# Patient Record
Sex: Female | Born: 1975 | Race: White | Hispanic: No | Marital: Married | Smoking: Never smoker
Health system: Southern US, Community
[De-identification: ages and names within clinical notes are randomized; demographics above are authoritative.]

## PROBLEM LIST (undated history)

## (undated) DIAGNOSIS — Z789 Other specified health status: Secondary | ICD-10-CM

## (undated) DIAGNOSIS — R87629 Unspecified abnormal cytological findings in specimens from vagina: Secondary | ICD-10-CM

## (undated) DIAGNOSIS — M419 Scoliosis, unspecified: Secondary | ICD-10-CM

## (undated) HISTORY — PX: WISDOM TOOTH EXTRACTION: SHX21

## (undated) HISTORY — DX: Scoliosis, unspecified: M41.9

---

## 1993-06-22 HISTORY — PX: HAND TENDON SURGERY: SHX663

## 1998-06-22 HISTORY — PX: SHOULDER ARTHROSCOPY: SHX128

## 2014-09-05 ENCOUNTER — Ambulatory Visit (HOSPITAL_COMMUNITY)
Admission: RE | Admit: 2014-09-05 | Discharge: 2014-09-05 | Disposition: A | Discharge status: Home / Self Care | Payer: BLUE CROSS/BLUE SHIELD | Source: Ambulatory Visit | Attending: Obstetrics and Gynecology | Admitting: Obstetrics and Gynecology

## 2014-09-05 ENCOUNTER — Encounter (HOSPITAL_COMMUNITY): Payer: Self-pay | Admitting: *Deleted

## 2014-09-05 ENCOUNTER — Ambulatory Visit (HOSPITAL_COMMUNITY): Payer: BLUE CROSS/BLUE SHIELD | Admitting: Certified Registered Nurse Anesthetist

## 2014-09-05 ENCOUNTER — Encounter (HOSPITAL_COMMUNITY)
Admission: RE | Disposition: A | Discharge status: Home / Self Care | Payer: Self-pay | Source: Ambulatory Visit | Attending: Obstetrics and Gynecology

## 2014-09-05 DIAGNOSIS — Q5211 Transverse vaginal septum: Secondary | ICD-10-CM | POA: Diagnosis not present

## 2014-09-05 DIAGNOSIS — O021 Missed abortion: Secondary | ICD-10-CM | POA: Diagnosis not present

## 2014-09-05 DIAGNOSIS — Z3A01 Less than 8 weeks gestation of pregnancy: Secondary | ICD-10-CM | POA: Diagnosis not present

## 2014-09-05 HISTORY — DX: Other specified health status: Z78.9

## 2014-09-05 HISTORY — PX: DILATION AND EVACUATION: SHX1459

## 2014-09-05 LAB — ABO/RH: ABO/RH(D): B POS

## 2014-09-05 LAB — CBC
HCT: 38.8 % (ref 36.0–46.0)
HEMOGLOBIN: 13.2 g/dL (ref 12.0–15.0)
MCH: 31.1 pg (ref 26.0–34.0)
MCHC: 34 g/dL (ref 30.0–36.0)
MCV: 91.5 fL (ref 78.0–100.0)
Platelets: 247 10*3/uL (ref 150–400)
RBC: 4.24 MIL/uL (ref 3.87–5.11)
RDW: 12.4 % (ref 11.5–15.5)
WBC: 7 10*3/uL (ref 4.0–10.5)

## 2014-09-05 SURGERY — DILATION AND EVACUATION, UTERUS
Anesthesia: Monitor Anesthesia Care | Site: Vagina

## 2014-09-05 MED ORDER — SCOPOLAMINE 1 MG/3DAYS TD PT72
MEDICATED_PATCH | TRANSDERMAL | Status: AC
  Administered 2014-09-05: 1.5 mg via TRANSDERMAL
  Filled 2014-09-05: qty 1

## 2014-09-05 MED ORDER — FENTANYL CITRATE 0.05 MG/ML IJ SOLN
INTRAMUSCULAR | Status: AC
  Filled 2014-09-05: qty 2

## 2014-09-05 MED ORDER — MEPERIDINE HCL 25 MG/ML IJ SOLN: 6.2500 mg | INTRAMUSCULAR | Status: DC | PRN

## 2014-09-05 MED ORDER — ACETAMINOPHEN 325 MG PO TABS: 325.0000 mg | ORAL_TABLET | ORAL | Status: DC | PRN

## 2014-09-05 MED ORDER — MIDAZOLAM HCL 2 MG/2ML IJ SOLN
INTRAMUSCULAR | Status: AC
  Filled 2014-09-05: qty 2

## 2014-09-05 MED ORDER — SCOPOLAMINE 1 MG/3DAYS TD PT72
1.0000 | MEDICATED_PATCH | Freq: Once | TRANSDERMAL | Status: DC
  Administered 2014-09-05: 1.5 mg via TRANSDERMAL

## 2014-09-05 MED ORDER — MIDAZOLAM HCL 2 MG/2ML IJ SOLN
INTRAMUSCULAR | Status: DC | PRN
  Administered 2014-09-05: 2 mg via INTRAVENOUS

## 2014-09-05 MED ORDER — ACETAMINOPHEN 160 MG/5ML PO SOLN: 325.0000 mg | ORAL | Status: DC | PRN

## 2014-09-05 MED ORDER — LIDOCAINE HCL (CARDIAC) 20 MG/ML IV SOLN
INTRAVENOUS | Status: DC | PRN
  Administered 2014-09-05: 40 mg via INTRAVENOUS

## 2014-09-05 MED ORDER — KETOROLAC TROMETHAMINE 30 MG/ML IJ SOLN
INTRAMUSCULAR | Status: DC | PRN
  Administered 2014-09-05: 30 mg via INTRAVENOUS

## 2014-09-05 MED ORDER — MIDAZOLAM HCL 2 MG/2ML IJ SOLN: 0.5000 mg | Freq: Once | INTRAMUSCULAR | Status: DC | PRN

## 2014-09-05 MED ORDER — LACTATED RINGERS IV SOLN
INTRAVENOUS | Status: DC
  Administered 2014-09-05 (×2): via INTRAVENOUS

## 2014-09-05 MED ORDER — ONDANSETRON HCL 4 MG/2ML IJ SOLN
INTRAMUSCULAR | Status: AC
  Filled 2014-09-05: qty 2

## 2014-09-05 MED ORDER — KETOROLAC TROMETHAMINE 30 MG/ML IJ SOLN: 30.0000 mg | Freq: Once | INTRAMUSCULAR | Status: DC | PRN

## 2014-09-05 MED ORDER — DEXAMETHASONE SODIUM PHOSPHATE 10 MG/ML IJ SOLN
INTRAMUSCULAR | Status: DC | PRN
  Administered 2014-09-05: 4 mg via INTRAVENOUS

## 2014-09-05 MED ORDER — ONDANSETRON HCL 4 MG/2ML IJ SOLN
INTRAMUSCULAR | Status: DC | PRN
  Administered 2014-09-05: 4 mg via INTRAVENOUS

## 2014-09-05 MED ORDER — PROMETHAZINE HCL 25 MG/ML IJ SOLN: 6.2500 mg | INTRAMUSCULAR | Status: DC | PRN

## 2014-09-05 MED ORDER — FENTANYL CITRATE 0.05 MG/ML IJ SOLN
25.0000 ug | INTRAMUSCULAR | Status: DC | PRN
  Administered 2014-09-05: 25 ug via INTRAVENOUS

## 2014-09-05 MED ORDER — IBUPROFEN 800 MG PO TABS: 800.0000 mg | ORAL_TABLET | Freq: Three times a day (TID) | ORAL | Status: DC | PRN

## 2014-09-05 MED ORDER — LIDOCAINE HCL 1 % IJ SOLN
INTRAMUSCULAR | Status: AC
  Filled 2014-09-05: qty 20

## 2014-09-05 MED ORDER — PROPOFOL 10 MG/ML IV EMUL
INTRAVENOUS | Status: DC | PRN
  Administered 2014-09-05 (×2): 40 mg via INTRAVENOUS
  Administered 2014-09-05: 20 mg via INTRAVENOUS
  Administered 2014-09-05: 40 mg via INTRAVENOUS
  Administered 2014-09-05: 20 mg via INTRAVENOUS

## 2014-09-05 MED ORDER — FENTANYL CITRATE 0.05 MG/ML IJ SOLN
INTRAMUSCULAR | Status: DC | PRN
  Administered 2014-09-05: 100 ug via INTRAVENOUS

## 2014-09-05 MED ORDER — LIDOCAINE HCL 1 % IJ SOLN
INTRAMUSCULAR | Status: DC | PRN
  Administered 2014-09-05: 15 mL

## 2014-09-05 SURGICAL SUPPLY — 17 items
CATH ROBINSON RED A/P 16FR (CATHETERS) ×3 IMPLANT
CLOTH BEACON ORANGE TIMEOUT ST (SAFETY) ×3 IMPLANT
DECANTER SPIKE VIAL GLASS SM (MISCELLANEOUS) ×3 IMPLANT
GLOVE BIO SURGEON STRL SZ7 (GLOVE) ×6 IMPLANT
GOWN STRL REUS W/TWL LRG LVL3 (GOWN DISPOSABLE) ×6 IMPLANT
KIT BERKELEY 1ST TRIMESTER 3/8 (MISCELLANEOUS) ×3 IMPLANT
NS IRRIG 1000ML POUR BTL (IV SOLUTION) ×3 IMPLANT
PACK VAGINAL MINOR WOMEN LF (CUSTOM PROCEDURE TRAY) ×3 IMPLANT
PAD OB MATERNITY 4.3X12.25 (PERSONAL CARE ITEMS) ×3 IMPLANT
PAD PREP 24X48 CUFFED NSTRL (MISCELLANEOUS) ×3 IMPLANT
SET BERKELEY SUCTION TUBING (SUCTIONS) ×3 IMPLANT
SUT CHROMIC 2 0 SH (SUTURE) ×3 IMPLANT
TOWEL OR 17X24 6PK STRL BLUE (TOWEL DISPOSABLE) ×6 IMPLANT
VACURETTE 10 RIGID CVD (CANNULA) IMPLANT
VACURETTE 7MM CVD STRL WRAP (CANNULA) ×3 IMPLANT
VACURETTE 8 RIGID CVD (CANNULA) IMPLANT
VACURETTE 9 RIGID CVD (CANNULA) IMPLANT

## 2014-09-05 NOTE — H&P (Signed)
NAME:  Sylvia Holmes, Sylvia Holmes                  ACCOUNT NO.:  MEDICAL RECORD NO.:  [id number]  LOCATION:                                 FACILITY:  PHYSICIAN:  Duke Salviaichard M. Marcelle OverlieHolland, M.D.DATE OF BIRTH:  21-Sep-1975  DATE OF ADMISSION: DATE OF DISCHARGE:                             HISTORY & PHYSICAL   CHIEF COMPLAINT:  Missed AB.  HPI:  A 39 year old, G1, P0, this patient was noted recently to have a missed AB by ultrasound criteria, initially was offered Cytotec for medical expulsion.  She did have some cramping, but failed to pass tissue, was followed up the day prior to surgery with followup ultrasound that showed a large gestational sac.  No fetal pole.  No yolk sac.  She is opted to proceed with scheduled D and E.  This procedure including specific risks related to bleeding, infection, other complications may require additional surgery reviewed with her.  PAST MEDICAL HISTORY:  Allergies:  Penicillin.  CURRENT MEDICATIONS:  Prenatal vitamins.  No other prescription medicines.  REVIEW OF SYSTEMS:  Unremarkable.  FAMILY HISTORY:  Mother with a history of thyroid disease.  Father with arthritis and her father has had an unspecified type of cancer.  SOCIAL HISTORY:  Denies drug or tobacco use, social alcohol use.  She is married.  PHYSICAL EXAMINATION:  VITAL SIGNS:  Temp 98.2, blood pressure 120/78. HEENT:  Unremarkable. NECK:  Supple without masses. LUNGS:  Clear. CARDIOVASCULAR:  Regular rate and rhythm without murmurs, rubs, or gallops.  BREASTS:  Not examined. ABDOMEN:  Soft, flat, nontender.  Vulva, vagina, cervix normal.  Uterus was 6 week size mid position.  Adnexa negative.  Her blood type is B positive.  IMPRESSION:  Missed abortion.  PLAN:  D and E procedure and risks reviewed as above.     Sylvia Holmes M. Marcelle OverlieHolland, M.D.     RMH/MEDQ  D:  09/05/2014  T:  09/05/2014  Job:  [id number]

## 2014-09-05 NOTE — Op Note (Signed)
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 The bladder was drained. Appropriate timeouts taken at that point. EUA revealed the uterus was 7 week size mid position, but on palpation felt that there may been a circumferential tight band in the upper vagina indeed upon placing the speculum revealed her to be remnants of 5 upper vaginal transverse septum. Speculum was positioned cervix grasped with tenaculum the cervical os are paracervical block was anchored infant will created by infiltrating at 3 and 9:00 submucosally, 5-7 cc 1% Xylocaine after negative aspiration at each site. The uterus is then easily sounded to 8 cm progressively dilated to a 23-24 Pratt dilator. A #7 curved suction curet was then used to curette a small amount of tissue. When no further tissue could be removed, a medium blunt curette was used to explore the cavity revealing it to be completely clear. On further inspection there was a superficial left vaginal laceration I think that was a result of stretching the upper vaginal band. This was reapproximated with 2-0 chromic interrupted sutures and was hemostatic. She tolerated this well went to recovery room in good condition.  Dictated with [facility]  Yaniyah Koors Milana ObeyM Biannca Scantlin M.D.

## 2014-09-05 NOTE — Anesthesia Postprocedure Evaluation (Signed)
  Anesthesia Post Note  Patient: Sylvia Holmes  Procedure(s) Performed: Procedure(s) (LRB): DILATATION AND EVACUATION (N/A)  Anesthesia type: MAC  Patient location: PACU  Post pain: Pain level controlled  Post assessment: Post-op Vital signs reviewed  Last Vitals:  Filed Vitals:   09/05/14 1650  BP: 97/56  Pulse: 50  Temp: 36.7 C  Resp: 20    Post vital signs: Reviewed  Level of consciousness: sedated  Complications: No apparent anesthesia complications

## 2014-09-05 NOTE — Progress Notes (Signed)
The patient was re-examined with no change in status 

## 2014-09-05 NOTE — Anesthesia Preprocedure Evaluation (Signed)
Anesthesia Evaluation  Patient identified by MRN, date of birth, ID band Patient awake    Reviewed: Allergy & Precautions, H&P , Patient's Chart, lab work & pertinent test results, reviewed documented beta blocker date and time   History of Anesthesia Complications Negative for: history of anesthetic complications  Airway Mallampati: II TM Distance: >3 FB Neck ROM: full    Dental   Pulmonary  breath sounds clear to auscultation        Cardiovascular Exercise Tolerance: Good Rhythm:regular Rate:Normal     Neuro/Psych negative psych ROS   GI/Hepatic   Endo/Other    Renal/GU      Musculoskeletal   Abdominal   Peds  Hematology   Anesthesia Other Findings   Reproductive/Obstetrics                           Anesthesia Physical Anesthesia Plan  ASA: II  Anesthesia Plan: MAC   Post-op Pain Management:    Induction:   Airway Management Planned:   Additional Equipment:   Intra-op Plan:   Post-operative Plan:   Informed Consent: I have reviewed the patients History and Physical, chart, labs and discussed the procedure including the risks, benefits and alternatives for the proposed anesthesia with the patient or authorized representative who has indicated his/her understanding and acceptance.   Dental Advisory Given  Plan Discussed with: CRNA, Surgeon and Anesthesiologist  Anesthesia Plan Comments:         Anesthesia Quick Evaluation  

## 2014-09-05 NOTE — H&P (Signed)
Duffy RhodyKelley Birge  DICTATION # [id number] CSN# [id number]   Meriel PicaHOLLAND,Tyquon Near M, MD 09/05/2014 10:14 AM

## 2014-09-05 NOTE — Discharge Instructions (Signed)
DISCHARGE INSTRUCTIONS: D&C / D&E The following instructions have been prepared to help you care for yourself upon your return home.  MAY REMOVE SCOP PATCH ON OR BEFORE 09/07/2014.  MAY TAKE IBUPROFEN PRODUCTS AFTER 8:45 PM AS NEEDED FOR CRAMPS.   Personal hygiene:  Use sanitary pads for vaginal drainage, not tampons.  Shower the day after your procedure.  NO tub baths, pools or Jacuzzis for 2-3 weeks.  Wipe front to back after using the bathroom.  Activity and limitations:  Do NOT drive or operate any equipment for 24 hours. The effects of anesthesia are still present and drowsiness may result.  Do NOT rest in bed all day.  Walking is encouraged.  Walk up and down stairs slowly.  You may resume your normal activity in one to two days or as indicated by your physician.  Sexual activity: NO intercourse for at least 2 weeks after the procedure, or as indicated by your physician.  Diet: Eat a light meal as desired this evening. You may resume your usual diet tomorrow.  Return to work: You may resume your work activities in one to two days or as indicated by your doctor.  What to expect after your surgery: Expect to have vaginal bleeding/discharge for 2-3 days and spotting for up to 10 days. It is not unusual to have soreness for up to 1-2 weeks. You may have a slight burning sensation when you urinate for the first day. Mild cramps may continue for a couple of days. You may have a regular period in 2-6 weeks.  Call your doctor for any of the following:  Excessive vaginal bleeding, saturating and changing one pad every hour.  Inability to urinate 6 hours after discharge from hospital.  Pain not relieved by pain medication.  Fever of 100.4 F or greater.  Unusual vaginal discharge or odor.

## 2014-09-05 NOTE — Transfer of Care (Signed)
Immediate Anesthesia Transfer of Care Note  Patient: Sylvia Holmes  Procedure(s) Performed: Procedure(s) with comments: DILATATION AND EVACUATION (N/A) - Repair Vaginal Laceration  Patient Location: PACU  Anesthesia Type:MAC  Level of Consciousness: awake, alert , oriented and patient cooperative  Airway & Oxygen Therapy: Patient Spontanous Breathing  Post-op Assessment: Report given to RN and Post -op Vital signs reviewed and stable  Post vital signs: Reviewed and stable  Last Vitals:  Filed Vitals:   09/05/14 1324  BP: 105/57  Pulse: 63  Temp: 36.7 C  Resp: 18    Complications: No apparent anesthesia complications

## 2014-09-06 ENCOUNTER — Encounter (HOSPITAL_COMMUNITY): Payer: Self-pay | Admitting: Obstetrics and Gynecology

## 2015-02-26 ENCOUNTER — Other Ambulatory Visit: Payer: Self-pay | Admitting: Obstetrics & Gynecology

## 2015-02-27 LAB — CYTOLOGY - PAP

## 2015-06-23 NOTE — L&D Delivery Note (Signed)
Delivery Note At 8:38 AM a viable female was delivered via Vaginal, Spontaneous Delivery (Presentation: Right Occiput Anterior).  APGAR and weight pending Placenta status: spontaneously with 3 vessel cord, .  Cord:  with the following complications:none .  Cord pH: not obtained  Anesthesia: Epidural  Episiotomy: None Lacerations:  first Suture Repair: 3.0 vicryl Est. Blood Loss (mL):  300  Mom to postpartum.  Baby to NICU.  Sylvia Holmes L 08/14/2015, 8:49 AM

## 2015-08-10 ENCOUNTER — Inpatient Hospital Stay (HOSPITAL_COMMUNITY)
Admission: AD | Admit: 2015-08-10 | Discharge: 2015-08-11 | Disposition: A | Discharge status: Home / Self Care | Payer: BLUE CROSS/BLUE SHIELD | Source: Ambulatory Visit | Attending: Obstetrics and Gynecology | Admitting: Obstetrics and Gynecology

## 2015-08-10 ENCOUNTER — Encounter (HOSPITAL_COMMUNITY): Payer: Self-pay

## 2015-08-10 DIAGNOSIS — N898 Other specified noninflammatory disorders of vagina: Secondary | ICD-10-CM

## 2015-08-10 DIAGNOSIS — Z880 Allergy status to penicillin: Secondary | ICD-10-CM | POA: Insufficient documentation

## 2015-08-10 DIAGNOSIS — O09523 Supervision of elderly multigravida, third trimester: Secondary | ICD-10-CM | POA: Insufficient documentation

## 2015-08-10 DIAGNOSIS — Z3A34 34 weeks gestation of pregnancy: Secondary | ICD-10-CM | POA: Diagnosis not present

## 2015-08-10 DIAGNOSIS — O9989 Other specified diseases and conditions complicating pregnancy, childbirth and the puerperium: Secondary | ICD-10-CM | POA: Insufficient documentation

## 2015-08-10 DIAGNOSIS — O26893 Other specified pregnancy related conditions, third trimester: Secondary | ICD-10-CM | POA: Diagnosis not present

## 2015-08-10 NOTE — Progress Notes (Signed)
toco adj by [facility]

## 2015-08-10 NOTE — MAU Note (Signed)
Manson Passey vag discharge today. Has decreased some. No pain.

## 2015-08-10 NOTE — Progress Notes (Signed)
Wet prep collected

## 2015-08-11 DIAGNOSIS — N898 Other specified noninflammatory disorders of vagina: Secondary | ICD-10-CM

## 2015-08-11 DIAGNOSIS — O26893 Other specified pregnancy related conditions, third trimester: Secondary | ICD-10-CM

## 2015-08-11 LAB — URINALYSIS, ROUTINE W REFLEX MICROSCOPIC
BILIRUBIN URINE: NEGATIVE
Glucose, UA: NEGATIVE mg/dL
Hgb urine dipstick: NEGATIVE
KETONES UR: 15 mg/dL — AB
Leukocytes, UA: NEGATIVE
Nitrite: NEGATIVE
PROTEIN: NEGATIVE mg/dL
Specific Gravity, Urine: 1.025 (ref 1.005–1.030)
pH: 6 (ref 5.0–8.0)

## 2015-08-11 LAB — WET PREP, GENITAL
Clue Cells Wet Prep HPF POC: NONE SEEN
Sperm: NONE SEEN
Trich, Wet Prep: NONE SEEN
Yeast Wet Prep HPF POC: NONE SEEN

## 2015-08-11 NOTE — Progress Notes (Signed)
Philipp Deputy CNM in to see pt and discuss test results and d/c plan. Written and verbal d/c instructions given and understanding voiced.

## 2015-08-11 NOTE — Discharge Instructions (Signed)

## 2015-08-11 NOTE — Progress Notes (Signed)
Kim Shaw CNM in to see pt 

## 2015-08-11 NOTE — MAU Provider Note (Signed)
Chief Complaint:  Vaginal Discharge   First Provider Initiated Contact with Patient 08/10/15 2338      Sylvia Holmes is a 40 y.o. G2P0010 at 11w0dwho presents to maternity admissions reporting brown vaginal discharge today. Has seen it intermittently but when it persisted, she came in. She reports good fetal movement, denies LOF, vaginal bleeding, vaginal itching/burning, urinary symptoms, h/a, dizziness, n/v, diarrhea, constipation or fever/chills.  She denies headache, visual changes or abdominal pain.  Does not feel and contractions. Does have urinary frequency.   Vaginal Discharge The patient's primary symptoms include vaginal bleeding (brown) and vaginal discharge. The patient's pertinent negatives include no genital itching, genital lesions, genital odor or pelvic pain. This is a new problem. The current episode started today. The problem occurs intermittently. The problem has been unchanged. The patient is experiencing no pain. She is pregnant. Associated symptoms include frequency. Pertinent negatives include no abdominal pain, back pain, chills, constipation, diarrhea, dysuria, flank pain, hematuria, nausea or vomiting. The vaginal discharge was brown. She has not been passing clots. She has not been passing tissue. Nothing aggravates the symptoms. She has tried nothing for the symptoms. She is not sexually active (No intercourse this week). She uses nothing for contraception.   RN Note: Manson Passey vag discharge today. Has decreased some. No pain.           Past Medical History: Past Medical History  Diagnosis Date  . Medical history non-contributory     Past obstetric history: OB History  Gravida Para Term Preterm AB SAB TAB Ectopic Multiple Living  # Outcome Date GA Lbr Len/2nd Weight Sex Delivery Anes PTL Lv  2 Current           1 SAB               Past Surgical History: Past Surgical History  Procedure Laterality Date  . Shoulder arthroscopy  2000    right  . Hand tendon surgery  1995    right  . Dilation and evacuation N/A 09/05/2014    Procedure: DILATATION AND EVACUATION;  Surgeon: Richarda Overlie, MD;  Location: [facility];  Service: Gynecology;  Laterality: N/A;  Repair Vaginal Laceration    Family History: History reviewed. No pertinent family history.  Social History: Social History  Substance Use Topics  . Smoking status: Never Smoker   . Smokeless tobacco: Never Used  . Alcohol Use: 2.4 oz/week    2 Glasses of wine, 2 Cans of beer per week     Comment: no alcohol with preg    Allergies:  Allergies  Allergen Reactions  . Mango Flavor Hives and Swelling  . Penicillins Hives    Meds:  Prescriptions prior to admission  Medication Sig Dispense Refill Last Dose  . calcium carbonate (TUMS - DOSED IN MG ELEMENTAL CALCIUM) 500 MG chewable tablet Chew 2 tablets by mouth daily.   Past Week at Unknown time  . Docosahexaenoic Acid (DHA) 200 MG CAPS Take by mouth.   08/10/2015 at Unknown time  . Prenatal Vit-Fe Fumarate-FA (PRENATAL MULTIVITAMIN) TABS tablet Take 1 tablet by mouth daily at 12 noon.   08/10/2015 at Unknown time  . clindamycin (CLEOCIN T) 1 % lotion Apply 1 application topically 2 (two) times daily as needed (For acne.).   09/04/2014 at Unknown time  . ibuprofen (ADVIL,MOTRIN) 800 MG tablet Take 1 tablet (800 mg total) by mouth every 8 (eight) hours as needed. 30  tablet 0   . ibuprofen (ADVIL,MOTRIN) 800 MG tablet Take 1 tablet (800 mg total) by mouth every 8 (eight) hours as needed. 30 tablet 0   . TURMERIC PO Take 1 capsule by mouth daily.   Past Week at Unknown time    I have reviewed patient's Past Medical Hx, Surgical Hx, Family Hx, Social Hx, medications and allergies.   ROS:  Review of Systems  Constitutional: Negative for chills.  Gastrointestinal: Negative for nausea, vomiting, abdominal pain, diarrhea and constipation.  Genitourinary: Positive for frequency and vaginal discharge. Negative for dysuria,  hematuria, flank pain and pelvic pain.  Musculoskeletal: Negative for back pain.  Neurological: Negative for dizziness.   Physical Exam  Patient Vitals for the past 24 hrs:  BP Temp Pulse Resp Height Weight  08/10/15 2231 119/75 mmHg - - - - -  08/10/15 2227 130/76 mmHg 98.2 F (36.8 C) 67 18 5\' 5"  (1.651 m) 183 lb (83.008 kg)   Constitutional: Well-developed, well-nourished female in no acute distress.  Cardiovascular: normal rate and rhythm Respiratory: normal effort, clear to auscultation bilaterally GI: Abd soft, non-tender, gravid appropriate for gestational age.   No rebound or guarding. MS: Extremities nontender, no edema, normal ROM Neurologic: Alert and oriented x 4.  GU: Neg CVAT.  PELVIC EXAM: Cervix pink, visually closed, without lesion, moderate light brown creamy discharge, vaginal walls and external genitalia normal Bimanual exam: Cervix soft, posterior, neg CMT, uterus nontender, Fundal Height consistent with dates, adnexa without tenderness, enlargement, or mass  Dilation: Closed Effacement (%): 30 Station: -3 Exam by:: Wynelle Bourgeois CNM  FHT:  Baseline 140 , moderate variability, accelerations present, no decelerations Contractions:  Uterine irritability   Labs: No results found for this or any previous visit (from the past 24 hour(s)). --/--/B POS (03/16 1320)  Imaging:  No results found.  MAU Course/MDM: I have ordered labs and reviewed results.  NST reviewed Consult Dr Rana Snare with presentation, exam findings and test results.  Treatments in MAU included PO hydration, wet prep and UA.    Assessment: SIUP at [redacted]w[redacted]d Uterine irritability Brown discharge, probably related to cervical effacement/friability  Plan: Report given to oncoming CNM.    Medication List    STOP taking these medications        clindamycin 1 % lotion  Commonly known as:  CLEOCIN T     ibuprofen 800 MG tablet  Commonly known as:  ADVIL,MOTRIN     TURMERIC PO      TAKE  these medications        calcium carbonate 500 MG chewable tablet  Commonly known as:  TUMS - dosed in mg elemental calcium  Chew 2 tablets by mouth daily.     DHA 200 MG Caps  Take by mouth.     prenatal multivitamin Tabs tablet  Take 1 tablet by mouth daily at 12 noon.       Pt stable at time of discharge.  Wynelle Bourgeois CNM, MSN Certified Nurse-Midwife 08/11/2015 12:04 AM   Pt feeling well; napping while waiting for test results  FHR 130s, +accels, no decels Occ UI, no pattern  Urinalysis    Component Value Date/Time   COLORURINE YELLOW 08/10/2015 2355   APPEARANCEUR CLEAR 08/10/2015 2355   LABSPEC 1.025 08/10/2015 2355   PHURINE 6.0 08/10/2015 2355   GLUCOSEU NEGATIVE 08/10/2015 2355   HGBUR NEGATIVE 08/10/2015 2355   BILIRUBINUR NEGATIVE 08/10/2015 2355   KETONESUR 15* 08/10/2015 2355   PROTEINUR NEGATIVE 08/10/2015 2355  NITRITE NEGATIVE 08/10/2015 2355   LEUKOCYTESUR NEGATIVE 08/10/2015 2355   Wet prep: many WBC, mod bact  IUP@34 .0wks Brown discharge Sl ketonuria  D/C home Rec increase PO fluids F/U at next scheduled OB visit  Cam Hai CNM 08/11/2015 1:13 AM

## 2015-08-13 ENCOUNTER — Inpatient Hospital Stay (HOSPITAL_COMMUNITY)
Admission: AD | Admit: 2015-08-13 | Discharge: 2015-08-16 | DRG: 775 | Disposition: A | Discharge status: Home / Self Care | Payer: BLUE CROSS/BLUE SHIELD | Source: Ambulatory Visit | Attending: Obstetrics and Gynecology | Admitting: Obstetrics and Gynecology

## 2015-08-13 ENCOUNTER — Inpatient Hospital Stay (HOSPITAL_COMMUNITY): Payer: BLUE CROSS/BLUE SHIELD

## 2015-08-13 ENCOUNTER — Encounter (HOSPITAL_COMMUNITY): Payer: Self-pay

## 2015-08-13 DIAGNOSIS — Z3A34 34 weeks gestation of pregnancy: Secondary | ICD-10-CM

## 2015-08-13 DIAGNOSIS — O42919 Preterm premature rupture of membranes, unspecified as to length of time between rupture and onset of labor, unspecified trimester: Secondary | ICD-10-CM

## 2015-08-13 DIAGNOSIS — O42913 Preterm premature rupture of membranes, unspecified as to length of time between rupture and onset of labor, third trimester: Secondary | ICD-10-CM

## 2015-08-13 HISTORY — DX: Unspecified abnormal cytological findings in specimens from vagina: R87.629

## 2015-08-13 LAB — OB RESULTS CONSOLE GC/CHLAMYDIA
CHLAMYDIA, DNA PROBE: NEGATIVE
Gonorrhea: NEGATIVE

## 2015-08-13 LAB — OB RESULTS CONSOLE ANTIBODY SCREEN: Antibody Screen: NEGATIVE

## 2015-08-13 LAB — TYPE AND SCREEN
ABO/RH(D): B POS
ANTIBODY SCREEN: NEGATIVE

## 2015-08-13 LAB — OB RESULTS CONSOLE ABO/RH: RH Type: POSITIVE

## 2015-08-13 LAB — OB RESULTS CONSOLE RPR: RPR: NONREACTIVE

## 2015-08-13 LAB — OB RESULTS CONSOLE HIV ANTIBODY (ROUTINE TESTING): HIV: NONREACTIVE

## 2015-08-13 LAB — OB RESULTS CONSOLE HEPATITIS B SURFACE ANTIGEN: Hepatitis B Surface Ag: NEGATIVE

## 2015-08-13 LAB — OB RESULTS CONSOLE RUBELLA ANTIBODY, IGM: Rubella: IMMUNE

## 2015-08-13 MED ORDER — AZITHROMYCIN 500 MG PO TABS
500.0000 mg | ORAL_TABLET | Freq: Every day | ORAL | Status: DC
  Filled 2015-08-13 (×2): qty 1

## 2015-08-13 MED ORDER — ZOLPIDEM TARTRATE 5 MG PO TABS: 5.0000 mg | ORAL_TABLET | Freq: Every evening | ORAL | Status: DC | PRN

## 2015-08-13 MED ORDER — DEXTROSE IN LACTATED RINGERS 5 % IV SOLN
INTRAVENOUS | Status: DC
  Administered 2015-08-13: 20:00:00 via INTRAVENOUS

## 2015-08-13 MED ORDER — CALCIUM CARBONATE ANTACID 500 MG PO CHEW: 2.0000 | CHEWABLE_TABLET | ORAL | Status: DC | PRN

## 2015-08-13 MED ORDER — ACETAMINOPHEN 325 MG PO TABS: 650.0000 mg | ORAL_TABLET | ORAL | Status: DC | PRN

## 2015-08-13 MED ORDER — DOCUSATE SODIUM 100 MG PO CAPS
100.0000 mg | ORAL_CAPSULE | Freq: Every day | ORAL | Status: DC
  Administered 2015-08-14 – 2015-08-16 (×3): 100 mg via ORAL
  Filled 2015-08-13 (×4): qty 1

## 2015-08-13 MED ORDER — PRENATAL MULTIVITAMIN CH: 1.0000 | ORAL_TABLET | Freq: Every day | ORAL | Status: DC

## 2015-08-13 MED ORDER — BETAMETHASONE SOD PHOS & ACET 6 (3-3) MG/ML IJ SUSP
12.0000 mg | INTRAMUSCULAR | Status: AC
  Administered 2015-08-13: 12 mg via INTRAMUSCULAR
  Filled 2015-08-13 (×2): qty 2

## 2015-08-13 MED ORDER — DEXTROSE 5 % IV SOLN
500.0000 mg | INTRAVENOUS | Status: DC
  Administered 2015-08-13: 500 mg via INTRAVENOUS
  Filled 2015-08-13 (×2): qty 500

## 2015-08-13 NOTE — MAU Note (Addendum)
Pt sent from MD office, states she is ruptured, confirmed by MD.  Started leaking @ 1430, clear/blood tinged fluid.  Has just recently started cramping.  Denies frank bleeding.  Sent to MAU to determine plan of care.

## 2015-08-13 NOTE — H&P (Signed)
NAMEBRANDE, UNCAPHER NO.:  [id number]  MEDICAL RECORD NO.:  [id number]  LOCATION:  UE45                          FACILITY:  [facility]  PHYSICIAN:  Duke Salvia. Marcelle Overlie, M.D.DATE OF BIRTH:  01-23-1976  DATE OF ADMISSION:  08/10/2015 DATE OF DISCHARGE:  08/11/2015                             HISTORY & PHYSICAL   CHIEF COMPLAINT:  Rupture of membranes at 34 and 2/7th.  HISTORY OF PRESENT ILLNESS:  A 40 year old, G2, P0-0-1-0 at 41 and 2/7th, presents today with gross ROM.  Clear fluid.  Verified by exam in the office.  Her 1 hour GTT was normal at 118.  Her panorama screening was normal.  PAST MEDICAL HISTORY:  She is B positive.  Please see the [facility] form for the remainder of her history.  PHYSICAL EXAMINATION:  VITAL SIGNS:  Temp 98.2, blood pressure 130/80. HEENT: Unremarkable. NECK:  Supple without masses. LUNGS:  Clear. CARDIOVASCULAR:  Regular rate and rhythm without murmurs, rubs, or gallops. BREASTS:  Not examined.  34 cm fundal height.  Fetal heart rate was 40. Gross clear fluid was noted. EXTREMITIES:  Unremarkable. NEUROLOGIC:  Unremarkable.  IMPRESSION:  A 34 and 2/7th weeks, preterm premature rupture of the membranes.  PLAN:  We will admit for observation, antibiotics, __________ series. We will obtain ultrasound to document presentation.     Kacen Mellinger M. Marcelle Overlie, M.D.     RMH/MEDQ  D:  08/13/2015  T:  08/13/2015  Job:  [id number]

## 2015-08-13 NOTE — Progress Notes (Signed)
Dr. Leary Roca informed of proposed admission of pt for PROM.  MD agrees to admission.

## 2015-08-13 NOTE — H&P (Signed)
Sylvia Holmes  DICTATION # [id number] CSN# [id number]   Meriel Pica, MD 08/13/2015 4:51 PM

## 2015-08-14 ENCOUNTER — Inpatient Hospital Stay (HOSPITAL_COMMUNITY): Payer: BLUE CROSS/BLUE SHIELD | Admitting: Anesthesiology

## 2015-08-14 ENCOUNTER — Encounter (HOSPITAL_COMMUNITY): Payer: Self-pay | Admitting: *Deleted

## 2015-08-14 LAB — CBC
HCT: 35.6 % — ABNORMAL LOW (ref 36.0–46.0)
HEMOGLOBIN: 12.3 g/dL (ref 12.0–15.0)
MCH: 31.4 pg (ref 26.0–34.0)
MCHC: 34.6 g/dL (ref 30.0–36.0)
MCV: 90.8 fL (ref 78.0–100.0)
Platelets: 240 10*3/uL (ref 150–400)
RBC: 3.92 MIL/uL (ref 3.87–5.11)
RDW: 13.3 % (ref 11.5–15.5)
WBC: 17.7 10*3/uL — AB (ref 4.0–10.5)

## 2015-08-14 MED ORDER — ONDANSETRON HCL 4 MG/2ML IJ SOLN: 4.0000 mg | INTRAMUSCULAR | Status: DC | PRN

## 2015-08-14 MED ORDER — CITRIC ACID-SODIUM CITRATE 334-500 MG/5ML PO SOLN: 30.0000 mL | ORAL | Status: DC | PRN

## 2015-08-14 MED ORDER — DIPHENHYDRAMINE HCL 25 MG PO CAPS: 25.0000 mg | ORAL_CAPSULE | Freq: Four times a day (QID) | ORAL | Status: DC | PRN

## 2015-08-14 MED ORDER — LACTATED RINGERS IV SOLN: 500.0000 mL | INTRAVENOUS | Status: DC | PRN

## 2015-08-14 MED ORDER — MEASLES, MUMPS & RUBELLA VAC ~~LOC~~ INJ: 0.5000 mL | INJECTION | Freq: Once | SUBCUTANEOUS | Status: DC

## 2015-08-14 MED ORDER — LIDOCAINE HCL (PF) 1 % IJ SOLN
INTRAMUSCULAR | Status: AC
  Filled 2015-08-14: qty 30

## 2015-08-14 MED ORDER — FLEET ENEMA 7-19 GM/118ML RE ENEM: 1.0000 | ENEMA | Freq: Every day | RECTAL | Status: DC | PRN

## 2015-08-14 MED ORDER — FENTANYL 2.5 MCG/ML BUPIVACAINE 1/10 % EPIDURAL INFUSION (WH - ANES)
14.0000 mL/h | INTRAMUSCULAR | Status: DC | PRN
  Administered 2015-08-14: 14 mL/h via EPIDURAL
  Filled 2015-08-14: qty 125

## 2015-08-14 MED ORDER — EPHEDRINE 5 MG/ML INJ
10.0000 mg | INTRAVENOUS | Status: DC | PRN
  Filled 2015-08-14: qty 2

## 2015-08-14 MED ORDER — PHENYLEPHRINE 40 MCG/ML (10ML) SYRINGE FOR IV PUSH (FOR BLOOD PRESSURE SUPPORT)
80.0000 ug | PREFILLED_SYRINGE | INTRAVENOUS | Status: DC | PRN
  Filled 2015-08-14: qty 2

## 2015-08-14 MED ORDER — LIDOCAINE HCL (PF) 1 % IJ SOLN
30.0000 mL | INTRAMUSCULAR | Status: DC | PRN
  Filled 2015-08-14: qty 30

## 2015-08-14 MED ORDER — OXYCODONE-ACETAMINOPHEN 5-325 MG PO TABS: 2.0000 | ORAL_TABLET | ORAL | Status: DC | PRN

## 2015-08-14 MED ORDER — MEDROXYPROGESTERONE ACETATE 150 MG/ML IM SUSP: 150.0000 mg | INTRAMUSCULAR | Status: DC | PRN

## 2015-08-14 MED ORDER — LIDOCAINE HCL (PF) 1 % IJ SOLN
INTRAMUSCULAR | Status: DC | PRN
  Administered 2015-08-14: 4 mL via EPIDURAL
  Administered 2015-08-14: 2 mL via EPIDURAL
  Administered 2015-08-14: 4 mL via EPIDURAL

## 2015-08-14 MED ORDER — WITCH HAZEL-GLYCERIN EX PADS: 1.0000 "application " | MEDICATED_PAD | CUTANEOUS | Status: DC | PRN

## 2015-08-14 MED ORDER — SIMETHICONE 80 MG PO CHEW: 80.0000 mg | CHEWABLE_TABLET | ORAL | Status: DC | PRN

## 2015-08-14 MED ORDER — ONDANSETRON HCL 4 MG/2ML IJ SOLN: 4.0000 mg | Freq: Four times a day (QID) | INTRAMUSCULAR | Status: DC | PRN

## 2015-08-14 MED ORDER — LACTATED RINGERS IV SOLN
500.0000 mL | Freq: Once | INTRAVENOUS | Status: AC
  Administered 2015-08-14: 07:00:00 via INTRAVENOUS

## 2015-08-14 MED ORDER — CLINDAMYCIN PHOSPHATE 900 MG/50ML IV SOLN
900.0000 mg | Freq: Once | INTRAVENOUS | Status: AC
  Administered 2015-08-14: 900 mg via INTRAVENOUS
  Filled 2015-08-14: qty 50

## 2015-08-14 MED ORDER — LACTATED RINGERS IV SOLN: INTRAVENOUS | Status: DC

## 2015-08-14 MED ORDER — DIPHENHYDRAMINE HCL 50 MG/ML IJ SOLN: 12.5000 mg | INTRAMUSCULAR | Status: DC | PRN

## 2015-08-14 MED ORDER — ACETAMINOPHEN 325 MG PO TABS: 650.0000 mg | ORAL_TABLET | ORAL | Status: DC | PRN

## 2015-08-14 MED ORDER — SENNOSIDES-DOCUSATE SODIUM 8.6-50 MG PO TABS
2.0000 | ORAL_TABLET | ORAL | Status: DC
  Administered 2015-08-14 – 2015-08-15 (×2): 2 via ORAL
  Filled 2015-08-14 (×2): qty 2

## 2015-08-14 MED ORDER — OXYTOCIN 10 UNIT/ML IJ SOLN
INTRAVENOUS | Status: AC
  Administered 2015-08-14: 2.5 [IU]/h via INTRAVENOUS
  Filled 2015-08-14: qty 4

## 2015-08-14 MED ORDER — IBUPROFEN 600 MG PO TABS
600.0000 mg | ORAL_TABLET | Freq: Four times a day (QID) | ORAL | Status: DC
  Administered 2015-08-14 – 2015-08-16 (×9): 600 mg via ORAL
  Filled 2015-08-14 (×10): qty 1

## 2015-08-14 MED ORDER — OXYCODONE-ACETAMINOPHEN 5-325 MG PO TABS: 1.0000 | ORAL_TABLET | ORAL | Status: DC | PRN

## 2015-08-14 MED ORDER — TERBUTALINE SULFATE 1 MG/ML IJ SOLN
0.2500 mg | Freq: Once | INTRAMUSCULAR | Status: AC
  Administered 2015-08-14: 0.25 mg via SUBCUTANEOUS
  Filled 2015-08-14: qty 1

## 2015-08-14 MED ORDER — OXYTOCIN BOLUS FROM INFUSION
500.0000 mL | INTRAVENOUS | Status: DC
  Administered 2015-08-14: 500 mL via INTRAVENOUS

## 2015-08-14 MED ORDER — PHENYLEPHRINE 40 MCG/ML (10ML) SYRINGE FOR IV PUSH (FOR BLOOD PRESSURE SUPPORT)
80.0000 ug | PREFILLED_SYRINGE | INTRAVENOUS | Status: DC | PRN
  Filled 2015-08-14: qty 2
  Filled 2015-08-14: qty 20

## 2015-08-14 MED ORDER — ZOLPIDEM TARTRATE 5 MG PO TABS: 5.0000 mg | ORAL_TABLET | Freq: Every evening | ORAL | Status: DC | PRN

## 2015-08-14 MED ORDER — TETANUS-DIPHTH-ACELL PERTUSSIS 5-2.5-18.5 LF-MCG/0.5 IM SUSP: 0.5000 mL | Freq: Once | INTRAMUSCULAR | Status: DC

## 2015-08-14 MED ORDER — LANOLIN HYDROUS EX OINT: TOPICAL_OINTMENT | CUTANEOUS | Status: DC | PRN

## 2015-08-14 MED ORDER — BENZOCAINE-MENTHOL 20-0.5 % EX AERO: 1.0000 "application " | INHALATION_SPRAY | CUTANEOUS | Status: DC | PRN

## 2015-08-14 MED ORDER — PRENATAL MULTIVITAMIN CH
1.0000 | ORAL_TABLET | Freq: Every day | ORAL | Status: DC
  Administered 2015-08-14 – 2015-08-16 (×3): 1 via ORAL
  Filled 2015-08-14 (×4): qty 1

## 2015-08-14 MED ORDER — BISACODYL 10 MG RE SUPP: 10.0000 mg | Freq: Every day | RECTAL | Status: DC | PRN

## 2015-08-14 MED ORDER — OXYTOCIN 10 UNIT/ML IJ SOLN
2.5000 [IU]/h | INTRAVENOUS | Status: DC
  Administered 2015-08-14: 2.5 [IU]/h via INTRAVENOUS

## 2015-08-14 MED ORDER — DIBUCAINE 1 % RE OINT
1.0000 "application " | TOPICAL_OINTMENT | RECTAL | Status: DC | PRN
  Filled 2015-08-14: qty 28

## 2015-08-14 MED ORDER — ONDANSETRON HCL 4 MG PO TABS: 4.0000 mg | ORAL_TABLET | ORAL | Status: DC | PRN

## 2015-08-14 NOTE — Anesthesia Procedure Notes (Signed)
Epidural Patient location during procedure: OB Start time: 08/14/2015 6:00 AM End time: 08/14/2015 6:08 AM  Staffing Anesthesiologist: Naidelin Gugliotta Performed by: anesthesiologist   Preanesthetic Checklist Completed: patient identified, site marked, surgical consent, pre-op evaluation, timeout performed, IV checked, risks and benefits discussed and monitors and equipment checked  Epidural Patient position: sitting Prep: site prepped and draped and DuraPrep Patient monitoring: continuous pulse ox and blood pressure Approach: midline Location: L3-L4 Injection technique: LOR saline  Needle:  Needle type: Tuohy  Needle gauge: 17 G Needle length: 9 cm and 9 Needle insertion depth: 4.5 cm Catheter type: closed end flexible Catheter size: 19 Gauge Catheter at skin depth: 9 cm Test dose: negative  Assessment Events: blood not aspirated, injection not painful, no injection resistance, negative IV test and no paresthesia  Additional Notes Patient identified. Risks/Benefits/Options discussed with patient including but not limited to bleeding, infection, nerve damage, paralysis, failed block, incomplete pain control, headache, blood pressure changes, nausea, vomiting, reactions to medications, itching and postpartum back pain. Confirmed with bedside nurse the patient's most recent platelet count. Confirmed with patient that they are not currently taking any anticoagulation, have any bleeding history or any family history of bleeding disorders. Patient expressed understanding and wished to proceed. All questions were answered. Sterile technique was used throughout the entire procedure. Please see nursing notes for vital signs. Test dose was given through epidural catheter and negative prior to continuing to dose epidural or start infusion. Warning signs of high block given to the patient including shortness of breath, tingling/numbness in hands, complete motor block, or any concerning symptoms  with instructions to call for help. Patient was given instructions on fall risk and not to get out of bed. All questions and concerns addressed with instructions to call with any issues or inadequate analgesia.

## 2015-08-14 NOTE — Progress Notes (Signed)
CSW acknowledges NICU admission.    Patient screened out for psychosocial assessment since none of the following apply:  Psychosocial stressors documented in mother or baby's chart  Gestation less than 32 weeks  Code at delivery   Infant with anomalies  Please contact the Clinical Social Worker if specific needs arise, or by MOB's request.       

## 2015-08-14 NOTE — Anesthesia Postprocedure Evaluation (Signed)
Anesthesia Post Note  Patient: Sylvia Holmes  Procedure(s) Performed: * No procedures listed *  Patient location during evaluation: [facility] Anesthesia Type: Epidural Level of consciousness: awake Pain management: pain level controlled Vital Signs Assessment: post-procedure vital signs reviewed and stable Respiratory status: spontaneous breathing Cardiovascular status: stable Postop Assessment: no headache, no backache, epidural receding, patient able to bend at knees, no signs of nausea or vomiting and adequate PO intake Anesthetic complications: no    Last Vitals:  Filed Vitals:   08/14/15 1100 08/14/15 1200  BP: 116/62 113/64  Pulse: 72 69  Temp: 36.9 C 36.4 C  Resp: 18 18    Last Pain:  Filed Vitals:   08/14/15 1519  PainSc: 0-No pain                 [name]

## 2015-08-14 NOTE — Anesthesia Preprocedure Evaluation (Signed)
Anesthesia Evaluation  Patient identified by MRN, date of birth, ID band Patient awake    Reviewed: Allergy & Precautions, H&P , NPO status , Patient's Chart, lab work & pertinent test results, reviewed documented beta blocker date and time   Airway Mallampati: II  TM Distance: >3 FB Neck ROM: full    Dental no notable dental hx.    Pulmonary neg pulmonary ROS,    Pulmonary exam normal breath sounds clear to auscultation       Cardiovascular negative cardio ROS Normal cardiovascular exam Rhythm:regular Rate:Normal     Neuro/Psych negative neurological ROS  negative psych ROS   GI/Hepatic negative GI ROS, Neg liver ROS,   Endo/Other  negative endocrine ROS  Renal/GU negative Renal ROS  negative genitourinary   Musculoskeletal   Abdominal   Peds  Hematology negative hematology ROS (+)   Anesthesia Other Findings Pregnancy - uncomplicated Platelets and allergies reviewed Denies active cardiac or pulmonary symptoms, METS > 4  Denies blood thinning medications, bleeding disorders, hypertension, asthma, supine hypotension syndrome, previous anesthesia difficulties   Reproductive/Obstetrics (+) Pregnancy                             Anesthesia Physical Anesthesia Plan  ASA: II  Anesthesia Plan: Epidural   Post-op Pain Management:    Induction:   Airway Management Planned:   Additional Equipment:   Intra-op Plan:   Post-operative Plan:   Informed Consent: I have reviewed the patients History and Physical, chart, labs and discussed the procedure including the risks, benefits and alternatives for the proposed anesthesia with the patient or authorized representative who has indicated his/her understanding and acceptance.     Plan Discussed with:   Anesthesia Plan Comments:         Anesthesia Quick Evaluation  

## 2015-08-14 NOTE — Lactation Note (Signed)
This note was copied from a baby's chart. Lactation Consultation Note  Patient Name: Sylvia Holmes ZOXWR'U Date: 08/14/2015 Reason for consult: Follow-up assessment Baby at 13 hr of life and is NPO. Mom is using the DEBP. RN called to say mom has cracked bleeding nipples from the pump. Moved mom up to the 27mm flange, decreased suction to 4 drops, and given comfort gels. Discussed pumping frequency, hands on pumping, and manual expression. Mom was in the NICU holding the baby. Suggested that mom call lactation to view a pumping.    Maternal Data    Feeding    LATCH Score/Interventions                      Lactation Tools Discussed/Used     Consult Status Consult Status: Follow-up Date: 08/15/15 Follow-up type: In-patient    Sylvia Holmes 08/14/2015, 10:16 PM

## 2015-08-15 LAB — CULTURE, BETA STREP (GROUP B ONLY)

## 2015-08-15 LAB — CBC
HEMATOCRIT: 30.4 % — AB (ref 36.0–46.0)
HEMOGLOBIN: 10.4 g/dL — AB (ref 12.0–15.0)
MCH: 31.6 pg (ref 26.0–34.0)
MCHC: 34.2 g/dL (ref 30.0–36.0)
MCV: 92.4 fL (ref 78.0–100.0)
Platelets: 211 10*3/uL (ref 150–400)
RBC: 3.29 MIL/uL — ABNORMAL LOW (ref 3.87–5.11)
RDW: 13.4 % (ref 11.5–15.5)
WBC: 15.3 10*3/uL — ABNORMAL HIGH (ref 4.0–10.5)

## 2015-08-15 NOTE — Lactation Note (Signed)
This note was copied from a baby's chart. Lactation Consultation Note  Patient Name: Sylvia Holmes ZOXWR'U Date: 08/15/2015 Reason for consult: Follow-up assessment;NICU baby  NICU baby 14 hours old. Mom able return-demonstrate hand expression with colostrum present. Mom reports that larger flanges more comfortable. Discussed using coconut oil to reduce friction, and EBM on nipples for healing. Mom has comfort gels, discussed how to clean. Enc to pump 8 times/24 hours followed by hand expression, and sleeping for 4-5 hour stretch at night. Mom given additional colostrum containers, and discussed EBM storage guidelines for NICU and how to transport EBM to hospital after D/C. Mom aware of pumping rooms in NICU and has personal pump at home.   Maternal Data Has patient been taught Hand Expression?: Yes Does the patient have breastfeeding experience prior to this delivery?: No  Feeding Feeding Type: Breast Fed Length of feed:  (LC assessed the first 10 minutes of BF.)  LATCH Score/Interventions Latch: Repeated attempts needed to sustain latch, nipple held in mouth throughout feeding, stimulation needed to elicit sucking reflex. Intervention(s): Skin to skin;Waking techniques Intervention(s): Adjust position;Assist with latch;Breast compression  Audible Swallowing: None  Type of Nipple: Everted at rest and after stimulation  Comfort (Breast/Nipple): Soft / non-tender     Hold (Positioning): Assistance needed to correctly position infant at breast and maintain latch. Intervention(s): Breastfeeding basics reviewed;Support Pillows;Position options;Skin to skin  LATCH Score: 6  Lactation Tools Discussed/Used Pump Review: Setup, frequency, and cleaning;Milk Storage Initiated by:: JW Date initiated:: 08/14/15   Consult Status Consult Status: Follow-up Date: 08/16/15 Follow-up type: In-patient    Geralynn Ochs 08/15/2015, 3:37 PM

## 2015-08-15 NOTE — Progress Notes (Signed)
Post Partum Day 1 Subjective: no complaints, up ad lib, voiding and tolerating PO baby stable in NICU  Objective: Blood pressure 107/71, pulse 46, temperature 98 F (36.7 C), temperature source Oral, resp. rate 16, height  (1.651 m), weight 182 lb 3.2 oz (82.645 kg), SpO2 98 %, unknown if currently breastfeeding.  Physical Exam:  General: alert and cooperative Lochia: appropriate Uterine Fundus: firm Incision: healing well DVT Evaluation: No evidence of DVT seen on physical exam. Negative Homan's sign. No cords or calf tenderness. No significant calf/ankle edema.   Recent Labs  08/14/15 0541 08/15/15 0539  HGB 12.3 10.4*  HCT 35.6* 30.4*    Assessment/Plan: Plan for discharge tomorrow   LOS: 2 days   CURTIS,CAROL G 08/15/2015, 8:41 AM

## 2015-08-15 NOTE — Lactation Note (Signed)
This note was copied from a baby's chart. Lactation Consultation Note  Patient Name: Sylvia Holmes ZOXWR'U Date: 08/15/2015 Reason for consult: Follow-up assessment;NICU baby NICU baby 62 hours old, in NICU for prematurity and RDS. Assisted mom to latch baby in football position to left breast. Baby fussy prior to latching. Mom able to hand express small drop of colostrum. Baby able to gently suckle this LC's gloved finger, but could not elicit rhythmic suckling at the breast. Baby only had a few non-nutritive suckles at breast. Discussed with mom the benefits of STS and attempts at breast both to baby for breastfeeding and to mom for a good milk supply. Left mom to enjoy having baby at beast.    Maternal Data Has patient been taught Hand Expression?: Yes Does the patient have breastfeeding experience prior to this delivery?: No  Feeding Feeding Type: Breast Fed Length of feed:  (LC assessed the first 10 minutes of BF.)  LATCH Score/Interventions Latch: Repeated attempts needed to sustain latch, nipple held in mouth throughout feeding, stimulation needed to elicit sucking reflex. Intervention(s): Skin to skin;Waking techniques Intervention(s): Adjust position;Assist with latch;Breast compression  Audible Swallowing: None  Type of Nipple: Everted at rest and after stimulation  Comfort (Breast/Nipple): Soft / non-tender     Hold (Positioning): Assistance needed to correctly position infant at breast and maintain latch. Intervention(s): Breastfeeding basics reviewed;Support Pillows;Position options;Skin to skin  LATCH Score: 6  Lactation Tools Discussed/Used Pump Review: Setup, frequency, and cleaning;Milk Storage Initiated by:: JW Date initiated:: 08/14/15   Consult Status Consult Status: Follow-up Date: 08/16/15 Follow-up type: In-patient    Geralynn Ochs 08/15/2015, 11:59 AM

## 2015-08-16 NOTE — Discharge Summary (Signed)
Obstetric Discharge Summary Reason for Admission: rupture of membranes Prenatal Procedures: ultrasound Intrapartum Procedures: spontaneous vaginal delivery Postpartum Procedures: none Complications-Operative and Postpartum: 1 degree perineal laceration HEMOGLOBIN  Date Value Ref Range Status  08/15/2015 10.4* 12.0 - 15.0 g/dL Final   HCT  Date Value Ref Range Status  08/15/2015 30.4* 36.0 - 46.0 % Final    Physical Exam:  General: alert and cooperative Lochia: appropriate Uterine Fundus: firm Incision: healing well DVT Evaluation: No evidence of DVT seen on physical exam. Negative Homan's sign. No cords or calf tenderness. No significant calf/ankle edema.  Discharge Diagnoses: s/p vag delivery at 34 weeks  Discharge Information: Date: 08/16/2015 Activity: pelvic rest Diet: routine Medications: PNV and Ibuprofen Condition: stable Instructions: refer to practice specific booklet Discharge to: home   Newborn Data: Live born female  Birth Weight: 4 lb 11.5 oz (2140 g) APGAR: 8, 9 Baby in NICU  Montray Kliebert G 08/16/2015, 9:03 AM

## 2015-08-16 NOTE — Progress Notes (Signed)
Pt verbalizes understanding of d/c instructions, medications, follow up appts, when to seek medical attention, and belongings policy. Pt was encouraged to check room thoroughly for her belongings. Pt has no questions at this time. Pt has no IV at time of d/c. Pt did not receive any prescriptions. Pt was going by the NICU prior to leaving so pt and husband walked to the NICU and will leave from there. Sheryn Bison

## 2015-08-16 NOTE — Lactation Note (Signed)
This note was copied from a baby's chart. Lactation Consultation Note  Patient Name: [name] ZOXWR'U Date: 08/16/2015 Reason for consult: Follow-up assessment  with this mom of a NICU baby, now 37 hours old, and 34 5/7 weeks CGA. Mom is being discharged to home today, and rented a symphony DEP for 2 weeks. Hand expression and pumping duration and frequency reviewed with mom. Mom encouraged to hold baby sin to skin, put baby to breast during ng feedings, having a  LPI and breastfeeding reviewed. Mom very receptive to teaching,mom instructed in use of [employer] DEP . Mom's milk seems to be transitioning in, and she demonstrated hand expression well. Mom knows to call for lactation as needed, while baby is in the NICU, and to make an o/p lactation appointment at baby's discharge to home.    Maternal Data    Feeding Feeding Type: Formula Nipple Type: Slow - flow Length of feed: 25 min (15 nippling/10 gravity gavage)  LATCH Score/Interventions                      Lactation Tools Discussed/Used Pump Review: Setup, frequency, and cleaning   Consult Status Consult Status: PRN Follow-up type: In-patient (NICU)    Sylvia Holmes 08/16/2015, 2:08 PM

## 2015-08-18 ENCOUNTER — Ambulatory Visit: Payer: Self-pay

## 2015-08-18 NOTE — Lactation Note (Signed)
This note was copied from a baby's chart. Lactation Consultation Note  Patient Name: Sylvia Holmes Comp [id number] Date: 08/18/2015   [facility] requested LC as she had some general questions regarding her milk supply.  Pumping every 2-4 hrs, and obtaining more milk from right side.  Right breast is softer on palpation, and left side has knots per Mom (LC did not palpate).  Obtaining 30-60 ml per pumping at present.  Mom knows to massage prior to using the DEBP, and use manual breast expression prior to and often pumping.  Talked about use of warm compress and massage while pumping, or use of ice packs for 20 minutes prior to pumping if breast is engorged with knots.  Pumping is comfortable, no pain, or pinching felt on nipples.  Mom in NICU with baby in her arms.  She has attempted breast feeding periodically when baby gets his tube feeding.  Recommended Clemence bring her pump parts and tubing to utilize the pumping rooms in the NICU while visiting with her baby.  Talked about baby skin to skin, prolactin levels and milk supply.  [name] has started doing some "power pumping", but reminded her that baby is only 53 days old, and her milk supply will most likely increase over next several days.  [name] knows to call for help as needed.  Follow up prn.       [name] 08/18/2015, 2:21 PM

## 2015-09-12 ENCOUNTER — Telehealth (HOSPITAL_COMMUNITY): Payer: Self-pay | Admitting: Lactation Services

## 2015-09-12 NOTE — Telephone Encounter (Signed)
Sylvia Holmes called with concern about the smell and taste of her frozen milk once thawed.  Sylvia Holmes is one month old and was discharged from the NICU 2 weeks ago.  Mom states milk was frozen in plastic containers provided by NICU.  She states milk remained frozen for transport home, is in the back of a stand alone freezer with a thermometer.  Mom states milk smells and tastes "funky".  FOB feels it smells sour.  Discussed the enzyme lipase which can cause a change in odor and taste when milk is frozen.  This is more noticeable when mom's have a high lipase content in their milk.  Informed mom that if this is the cause the milk is safe for baby although baby may not accept because of taste.  In this case suggested she try mixing half fresh and half frozen milk.  [facility] for more detailed information including scalding of fresh milk to decrease lipase.  I explained to mom to do what she feels most comfortable with.  Mom feels like she will give milk to baby and watch for GI upset.  She will also try freezing in other containers.

## 2016-01-18 ENCOUNTER — Telehealth (HOSPITAL_COMMUNITY): Payer: Self-pay | Admitting: Lactation Services

## 2016-01-18 NOTE — Telephone Encounter (Signed)
Mom called, left message and I called her back. Concerned that baby is not wanting to nurse and feels milk supply has decreased. 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 Pumping about q 4 hours. Taking mother's milk tea for a few days. Has tried power pumping yesterday. Suggested if baby too frantic to nurse, to feed some from bottle to calm him then try to latch. Can try pumping to milk going, then latching baby. Encouraged to try to build milk supply and to latch when baby is calm/sleepy.Encouragement given. No further questions at present. To call back prn

## 2022-03-22 HISTORY — PX: ROTATOR CUFF REPAIR: SHX139

## 2023-03-16 ENCOUNTER — Encounter: Payer: Self-pay | Admitting: Physician Assistant

## 2023-03-16 ENCOUNTER — Encounter: Payer: Self-pay | Admitting: Gastroenterology

## 2023-03-16 ENCOUNTER — Ambulatory Visit (INDEPENDENT_AMBULATORY_CARE_PROVIDER_SITE_OTHER): Payer: 59 | Admitting: Physician Assistant

## 2023-03-16 VITALS — BP 94/60 | HR 72 | Ht 64.0 in | Wt 162.2 lb

## 2023-03-16 DIAGNOSIS — Z1211 Encounter for screening for malignant neoplasm of colon: Secondary | ICD-10-CM | POA: Diagnosis not present

## 2023-03-16 DIAGNOSIS — K625 Hemorrhage of anus and rectum: Secondary | ICD-10-CM | POA: Diagnosis not present

## 2023-03-16 DIAGNOSIS — Z1212 Encounter for screening for malignant neoplasm of rectum: Secondary | ICD-10-CM

## 2023-03-16 DIAGNOSIS — K642 Third degree hemorrhoids: Secondary | ICD-10-CM

## 2023-03-16 DIAGNOSIS — K6289 Other specified diseases of anus and rectum: Secondary | ICD-10-CM

## 2023-03-16 MED ORDER — HYDROCORTISONE (PERIANAL) 2.5 % EX CREA: 1.0000 | TOPICAL_CREAM | Freq: Two times a day (BID) | CUTANEOUS | 1 refills | Status: AC

## 2023-03-16 MED ORDER — NA SULFATE-K SULFATE-MG SULF 17.5-3.13-1.6 GM/177ML PO SOLN: 1.0000 | Freq: Once | ORAL | 0 refills | Status: AC

## 2023-03-16 NOTE — Patient Instructions (Signed)
We have sent the following medications to your pharmacy for you to pick up at your convenience: Hydrocortisone cream 2.5% twice daily for two weeks.   Use OTC Reticare with Lidocaine as needed for pain.  You have been scheduled for a colonoscopy. Please follow written instructions given to you at your visit today.   Please pick up your prep supplies at the pharmacy within the next 1-3 days.  If you use inhalers (even only as needed), please bring them with you on the day of your procedure.  DO NOT TAKE 7 DAYS PRIOR TO TEST- Trulicity (dulaglutide) Ozempic, Wegovy (semaglutide) Mounjaro (tirzepatide) Bydureon Bcise (exanatide extended release)  DO NOT TAKE 1 DAY PRIOR TO YOUR TEST Rybelsus (semaglutide) Adlyxin (lixisenatide) Victoza (liraglutide) Byetta (exanatide) _______________________________________________________  If your blood pressure at your visit was 140/90 or greater, please contact your primary care physician to follow up on this.  _______________________________________________________  If you are age 3 or older, your body mass index should be between 23-30. Your Body mass index is 27.85 kg/m. If this is out of the aforementioned range listed, please consider follow up with your Primary Care Provider.  If you are age 65 or younger, your body mass index should be between 19-25. Your Body mass index is 27.85 kg/m. If this is out of the aformentioned range listed, please consider follow up with your Primary Care Provider.   ________________________________________________________  The [facility] providers would like to encourage you to use [facility] to communicate with providers for non-urgent requests or questions.  Due to long hold times on the telephone, sending your provider a message by [facility] may be a faster and more efficient way to get a response.  Please allow 48 business hours for a response.  Please remember that this is for non-urgent requests.   _______________________________________________________

## 2023-03-16 NOTE — Progress Notes (Signed)
Chief Complaint: Hemorrhoids and discuss colonoscopy  HPI:    Sylvia Holmes is a 47 year old female with a past medical history as listed below, who was referred to me by Mitchel Honour, DO for a complaint of hemorrhoids and to discuss a colonoscopy.      02/18/2022 patient seen by PCP at that time referred for screening colonoscopy.    Today, the patient presents to clinic and explains that she has had trouble with hemorrhoids ever since she ran marathons and lifted weights and then worsened by having a child about 7 years ago.  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  Occasionally she will see bleeding from these hemorrhoids but over the past 2 years has had transient heavy bleeding where it is just "dripping into the toilet".  Tells me that at times it feels like there is a "stabbing in my anus", it can come and go throughout the day but seems unrelated to how bad her hemorrhoids are.  Bowel movements are regular and normal, no abdominal pain or constant rectal pain.  She has tried over-the-counter Preparation H but not much else.    Denies fever, chills, weight loss, nausea or vomiting.  Past Medical History:  Diagnosis Date   Medical history non-contributory    Vaginal Pap smear, abnormal     Past Surgical History:  Procedure Laterality Date   DILATION AND EVACUATION N/A 09/05/2014   Procedure: DILATATION AND EVACUATION;  Surgeon: Richarda Overlie, MD;  Location: [facility];  Service: Gynecology;  Laterality: N/A;  Repair Vaginal Laceration   HAND TENDON SURGERY  1995   right   SHOULDER ARTHROSCOPY  2000   right   WISDOM TOOTH EXTRACTION      Current Outpatient Medications  Medication Sig Dispense Refill   Prenatal Vit-Fe Fumarate-FA (PRENATAL MULTIVITAMIN) TABS tablet Take 1 tablet by mouth daily at 12 noon.     No current facility-administered medications for this visit.    Allergies as of 03/16/2023 - Review Complete 08/13/2015  Allergen Reaction Noted   Mango  flavor Hives and Swelling 09/04/2014   Penicillins Hives 09/04/2014    No family history on file.  Social History   Socioeconomic History   Marital status: Married    Spouse name: Not on file   Number of children: Not on file   Years of education: Not on file   Highest education level: Not on file  Occupational History   Not on file  Tobacco Use   Smoking status: Never   Smokeless tobacco: Never  Substance and Sexual Activity   Alcohol use: Yes    Alcohol/week: 4.0 standard drinks of alcohol    Types: 2 Glasses of wine, 2 Cans of beer per week    Comment: no alcohol with preg   Drug use: No   Sexual activity: Yes  Other Topics Concern   Not on file  Social History Narrative   Not on file   Social Determinants of Health   Financial Resource Strain: Not on file  Food Insecurity: Not on file  Transportation Needs: Not on file  Physical Activity: Not on file  Stress: Not on file  Social Connections: Not on file  Intimate Partner Violence: Not on file    Review of Systems:    Constitutional: No weight loss, fever or chills Skin: No rash  Cardiovascular: No chest pain  Respiratory: No SOB  Gastrointestinal: See HPI and otherwise negative Genitourinary: No dysuria Neurological: No headache, dizziness  or syncope Musculoskeletal: No new muscle or joint pain Hematologic: No bruising Psychiatric: No history of depression or anxiety    Physical Exam:  Vital signs: BP 94/60 (BP Location: Left Arm, Patient Position: Sitting, Cuff Size: Large)   Pulse 72   Ht 5\' 4"  (1.626 m) Comment: height measured without shoes  Wt 162 lb 4 oz (73.6 kg)   Breastfeeding No   BMI 27.85 kg/m    Constitutional:   Pleasant Caucasian female appears to be in NAD, Well developed, Well nourished, alert and cooperative Head:  Normocephalic and atraumatic. Eyes:   PEERL, EOMI. No icterus. Conjunctiva pink. Ears:  Normal auditory acuity. Neck:  Supple Throat: Oral cavity and pharynx  without inflammation, swelling or lesion.  Respiratory: Respirations even and unlabored. Lungs clear to auscultation bilaterally.   No wheezes, crackles, or rhonchi.  Cardiovascular: Normal S1, S2. No MRG. Regular rate and rhythm. No peripheral edema, cyanosis or pallor.  Gastrointestinal:  Soft, nondistended, nontender. No rebound or guarding. Normal bowel sounds. No appreciable masses or hepatomegaly. Rectal: External: 2 external hemorrhoids, noninflamed and 1 prolapsed internal hemorrhoids slightly thrombosed and tender to palpation; internal: Fullness and tenderness; and anoscopy: Unable to perform due to discomfort Msk:  Symmetrical without gross deformities. Without edema, no deformity or joint abnormality.  Neurologic:  Alert and  oriented x4;  grossly normal neurologically.  Skin:   Dry and intact without significant lesions or rashes. Psychiatric: Demonstrates good judgement and reason without abnormal affect or behaviors.  No recent labs or imaging.  Assessment: 1.  Hemorrhoids: External hemorrhoids and a prolapsed internal hemorrhoid today on exam which was tender 2.  Rectal bleeding: Heavy bleeding off-and-on over the past 2 years which patient is associated with hemorrhoids; consider hemorrhoids versus fissure versus polyps 3.  Screening for colorectal cancer: Patient is 58 and has never had a screening colonoscopy  Plan: 1.  Scheduled patient for a diagnostic colonoscopy for rectal bleeding in the [facility] with Dr. Lavon Paganini as she had sooner availability and patient is very anxious.  Did provide the patient a detailed list of risks for the procedure and she agrees to proceed. 2.  Prescribed Hydrocortisone ointment 2.5% to be applied to external and prolapsed internal hemorrhoid twice daily over the next 1 to 2 weeks. 3.  Discussed sitz bath's for 15 to 20 minutes 2-3 times a day  4.  Discussed that the patient can be evaluated for hemorrhoid banding during time of colonoscopy. 5.   Recommend over-the-counter RectiCare cream with lidocaine as needed for discomfort 6.  Patient to follow in clinic per recommendations from Dr. Lavon Paganini after time of procedure.  Hyacinth Meeker, PA-C [facility] 03/16/2023, 8:38 AM  Cc: Mitchel Honour, DO

## 2023-03-18 ENCOUNTER — Encounter: Payer: Self-pay | Admitting: Certified Registered Nurse Anesthetist

## 2023-03-19 ENCOUNTER — Ambulatory Visit (AMBULATORY_SURGERY_CENTER): Payer: 59 | Admitting: Gastroenterology

## 2023-03-19 ENCOUNTER — Encounter: Payer: Self-pay | Admitting: Gastroenterology

## 2023-03-19 VITALS — BP 121/68 | HR 58 | Temp 98.6°F | Resp 18 | Ht 64.0 in | Wt 162.0 lb

## 2023-03-19 DIAGNOSIS — K625 Hemorrhage of anus and rectum: Secondary | ICD-10-CM | POA: Diagnosis not present

## 2023-03-19 DIAGNOSIS — D125 Benign neoplasm of sigmoid colon: Secondary | ICD-10-CM

## 2023-03-19 MED ORDER — SODIUM CHLORIDE 0.9 % IV SOLN: 500.0000 mL | Freq: Once | INTRAVENOUS | Status: DC

## 2023-03-19 MED ORDER — HYDROCORTISONE ACETATE 25 MG RE SUPP: 25.0000 mg | Freq: Two times a day (BID) | RECTAL | 0 refills | Status: AC

## 2023-03-19 NOTE — Progress Notes (Signed)
Report given to PACU, vss 

## 2023-03-19 NOTE — Op Note (Signed)
[facility] Patient Name: Sylvia Holmes Procedure Date: 03/19/2023 1:06 PM MRN: [id number] Endoscopist: Napoleon Form , MD, [id number] Age: 47 Referring MD:  Date of Birth: 11-01-1975 Gender: Female Account #: [id number] Procedure:                Colonoscopy Indications:              Evaluation of unexplained GI bleeding presenting                            with Hematochezia Medicines:                Monitored Anesthesia Care Procedure:                Pre-Anesthesia Assessment:                           - Prior to the procedure, a History and Physical                            was performed, and patient medications and                            allergies were reviewed. The patient's tolerance of                            previous anesthesia was also reviewed. The risks                            and benefits of the procedure and the sedation                            options and risks were discussed with the patient.                            All questions were answered, and informed consent                            was obtained. Prior Anticoagulants: The patient has                            taken no anticoagulant or antiplatelet agents. ASA                            Grade Assessment: II - A patient with mild systemic                            disease. After reviewing the risks and benefits,                            the patient was deemed in satisfactory condition to                            undergo the procedure.  After obtaining informed consent, the colonoscope                            was passed under direct vision. Throughout the                            procedure, the patient's blood pressure, pulse, and                            oxygen saturations were monitored continuously. The                            PCF-HQ190L Colonoscope [id number] was introduced                            through the anus and advanced to the  the cecum,                            identified by appendiceal orifice and ileocecal                            valve. The colonoscopy was performed without                            difficulty. The patient tolerated the procedure                            well. The quality of the bowel preparation was                            good. The ileocecal valve, appendiceal orifice, and                            rectum were photographed. Scope In: 1:26:11 PM Scope Out: 1:49:57 PM Scope Withdrawal Time: 0 hours 14 minutes 46 seconds  Total Procedure Duration: 0 hours 23 minutes 46 seconds  Findings:                 The perianal and digital rectal examinations were                            normal.                           A 4 mm polyp was found in the sigmoid colon. The                            polyp was sessile. The polyp was removed with a                            cold snare. Resection and retrieval were complete.                           A few small-mouthed diverticula were found in the  sigmoid colon and descending colon.                           Non-bleeding external and internal hemorrhoids were                            found during retroflexion. The hemorrhoids were                            medium-sized. Complications:            No immediate complications. Estimated Blood Loss:     Estimated blood loss was minimal. Impression:               - One 4 mm polyp in the sigmoid colon, removed with                            a cold snare. Resected and retrieved.                           - Diverticulosis in the sigmoid colon and in the                            descending colon.                           - Non-bleeding external and internal hemorrhoids. Recommendation:           - Patient has a contact number available for                            emergencies. The signs and symptoms of potential                            delayed complications were  discussed with the                            patient. Return to normal activities tomorrow.                            Written discharge instructions were provided to the                            patient.                           - Resume previous diet.                           - Continue present medications.                           - Await pathology results.                           - Repeat colonoscopy in 5-10 years for surveillance.                           -  Hydrocortisone suppositories BID 5-7 days                           - Return to GI clinic at the next available                            appointment for hemorrhoid band ligation. Napoleon Form, MD 03/19/2023 1:56:52 PM This report has been signed electronically.

## 2023-03-19 NOTE — Progress Notes (Signed)
Vitals-SH  Pt's states no medical or surgical changes since previsit or office visit.  Medial reviewed.

## 2023-03-19 NOTE — Progress Notes (Signed)
[facility] History and Physical   Primary Care Physician:  Patient, No Pcp Per   Reason for Procedure:  Rectal bleeding  Plan:    Screening colonoscopy with possible interventions as needed     HPI: Sylvia Holmes is a very pleasant 47 y.o. female here for  colonoscopy for evaluation of rectal bleeding.  Please refer to office note by Hyacinth Meeker for additional details  The risks and benefits as well as alternatives of endoscopic procedure(s) have been discussed and reviewed. All questions answered. The patient agrees to proceed.    Past Medical History:  Diagnosis Date   Medical history non-contributory    Scoliosis    Vaginal Pap smear, abnormal     Past Surgical History:  Procedure Laterality Date   DILATION AND EVACUATION N/A 09/05/2014   Procedure: DILATATION AND EVACUATION;  Surgeon: Richarda Overlie, MD;  Location: [facility];  Service: Gynecology;  Laterality: N/A;  Repair Vaginal Laceration   HAND TENDON SURGERY  06/22/1993   right   ROTATOR CUFF REPAIR Right 03/2022   SHOULDER ARTHROSCOPY  06/22/1998   right   WISDOM TOOTH EXTRACTION      Prior to Admission medications   Medication Sig Start Date End Date Taking? Authorizing Provider  hydrocortisone (ANUSOL-HC) 2.5 % rectal cream Place 1 Application rectally 2 (two) times daily. 03/16/23  Yes Unk Lightning, PA  ibuprofen (ADVIL) 200 MG tablet Take 600-800 mg by mouth as needed. 09/23/20  Yes [provider]  LO LOESTRIN FE 1 MG-10 MCG / 10 MCG tablet Take 1 tablet by mouth daily. 03/15/23  Yes [provider]  Prenatal Vit-Fe Fumarate-FA (PRENATAL MULTIVITAMIN) TABS tablet Take 1 tablet by mouth daily at 12 noon.   Yes [provider]  EPINEPHrine 0.3 mg/0.3 mL IJ SOAJ injection INJECT 0.3 MG INTRAMUSCLULARY AS DIRECTED MAY REPEAT DOSE X1 AFTER 5-15MIN    [provider]  methocarbamol (ROBAXIN) 500 MG tablet Take 500-1,000 mg by mouth as needed for muscle  spasms.    [provider]  Omega-3 Fatty Acids (OMEGA 3 PO) Take 1 capsule by mouth as needed.    [provider]    Current Outpatient Medications  Medication Sig Dispense Refill   hydrocortisone (ANUSOL-HC) 2.5 % rectal cream Place 1 Application rectally 2 (two) times daily. 30 g 1   ibuprofen (ADVIL) 200 MG tablet Take 600-800 mg by mouth as needed.     LO LOESTRIN FE 1 MG-10 MCG / 10 MCG tablet Take 1 tablet by mouth daily.     Prenatal Vit-Fe Fumarate-FA (PRENATAL MULTIVITAMIN) TABS tablet Take 1 tablet by mouth daily at 12 noon.     EPINEPHrine 0.3 mg/0.3 mL IJ SOAJ injection INJECT 0.3 MG INTRAMUSCLULARY AS DIRECTED MAY REPEAT DOSE X1 AFTER 5-15MIN     methocarbamol (ROBAXIN) 500 MG tablet Take 500-1,000 mg by mouth as needed for muscle spasms.     Omega-3 Fatty Acids (OMEGA 3 PO) Take 1 capsule by mouth as needed.     Current Facility-Administered Medications  Medication Dose Route Frequency Provider Last Rate Last Admin   0.9 %  sodium chloride infusion  500 mL Intravenous Once Napoleon Form, MD        Allergies as of 03/19/2023 - Review Complete 03/19/2023  Allergen Reaction Noted   Mango flavor Hives and Swelling 09/04/2014   Penicillins Hives 09/04/2014    Family History  Problem Relation Age of Onset   Heart disease Mother    Autoimmune disease  Mother    Hypertension Mother    Fibromyalgia Mother    Other Father        kidney tumor   Heart disease Father    Obesity Father    Ovarian cancer Paternal Grandmother        abdominal tumor   Heart attack Paternal Grandfather    Rectal cancer Neg Hx    Colon cancer Neg Hx    Stomach cancer Neg Hx    Esophageal cancer Neg Hx     Social History   Socioeconomic History   Marital status: Married    Spouse name: Not on file   Number of children: 1   Years of education: Not on file   Highest education level: Not on file  Occupational History   Occupation: International aid/development worker  Tobacco Use    Smoking status: Never   Smokeless tobacco: Never  Vaping Use   Vaping status: Never Used  Substance and Sexual Activity   Alcohol use: Yes    Alcohol/week: 4.0 standard drinks of alcohol    Types: 2 Glasses of wine, 2 Cans of beer per week    Comment: occasional   Drug use: No   Sexual activity: Yes  Other Topics Concern   Not on file  Social History Narrative   Not on file   Social Determinants of Health   Financial Resource Strain: Not on file  Food Insecurity: Not on file  Transportation Needs: Not on file  Physical Activity: Not on file  Stress: Not on file  Social Connections: Not on file  Intimate Partner Violence: Not on file    Review of Systems:  All other review of systems negative except as mentioned in the HPI.  Physical Exam: Vital signs in last 24 hours: BP (!) 147/76 (BP Location: Left Arm, Patient Position: Sitting, Cuff Size: Normal)   Pulse 62   Temp 98.6 F (37 C) (Temporal)   Ht 5\' 4"  (1.626 m)   Wt 162 lb (73.5 kg)   SpO2 97%   BMI 27.81 kg/m  General:   Alert, NAD Lungs:  Clear .   Heart:  Regular rate and rhythm Abdomen:  Soft, nontender and nondistended. Neuro/Psych:  Alert and cooperative. Normal mood and affect. A and O x 3  Reviewed labs, radiology imaging, old records and pertinent past GI work up  Patient is appropriate for planned procedure(s) and anesthesia in an ambulatory setting   K. Scherry Ran , MD [phone]

## 2023-03-19 NOTE — Patient Instructions (Signed)
-   Resume previous diet. - Continue present medications. - Await pathology results. - Repeat colonoscopy in 5-10 years for surveillance. - Hydrocortisone suppositories BID 5-7 days - Return to GI clinic at the next available appointment for hemorrhoid band ligation. - Dr. Lavon Paganini office nurse will call and schedule an appointment for the Hemorrhoid band ligation.   YOU HAD AN ENDOSCOPIC PROCEDURE TODAY AT THE [facility]:   Refer to the procedure report that was given to you for any specific questions about what was found during the examination.  If the procedure report does not answer your questions, please call your gastroenterologist to clarify.  If you requested that your care partner not be given the details of your procedure findings, then the procedure report has been included in a sealed envelope for you to review at your convenience later.  YOU SHOULD EXPECT: Some feelings of bloating in the abdomen. Passage of more gas than usual.  Walking can help get rid of the air that was put into your GI tract during the procedure and reduce the bloating. If you had a lower endoscopy (such as a colonoscopy or flexible sigmoidoscopy) you may notice spotting of blood in your stool or on the toilet paper. If you underwent a bowel prep for your procedure, you may not have a normal bowel movement for a few days.  Please Note:  You might notice some irritation and congestion in your nose or some drainage.  This is from the oxygen used during your procedure.  There is no need for concern and it should clear up in a day or so.  SYMPTOMS TO REPORT IMMEDIATELY:  Following lower endoscopy (colonoscopy or flexible sigmoidoscopy):  Excessive amounts of blood in the stool  Significant tenderness or worsening of abdominal pains  Swelling of the abdomen that is new, acute  Fever of 100F or higher  For urgent or emergent issues, a gastroenterologist can be reached at any hour by calling [phone]  [phone]. Do not use MyChart messaging for urgent concerns.    DIET:  We do recommend a small meal at first, but then you may proceed to your regular diet.  Drink plenty of fluids but you should avoid alcoholic beverages for 24 hours.  ACTIVITY:  You should plan to take it easy for the rest of today and you should NOT DRIVE or use heavy machinery until tomorrow (because of the sedation medicines used during the test).    FOLLOW UP: Our staff will call the number listed on your records the next business day following your procedure.  We will call around 7:15- 8:00 am to check on you and address any questions or concerns that you may have regarding the information given to you following your procedure. If we do not reach you, we will leave a message.     If any biopsies were taken you will be contacted by phone or by letter within the next 1-3 weeks.  Please call us at [phone] if you have not heard about the biopsies in 3 weeks.    SIGNATURES/CONFIDENTIALITY: You and/or your care partner have signed paperwork which will be entered into your electronic medical record.  These signatures attest to the fact that that the information above on your After Visit Summary has been reviewed and is understood.  Full responsibility of the confidentiality of this discharge information lies with you and/or your care-partner.

## 2023-03-22 ENCOUNTER — Telehealth: Payer: Self-pay

## 2023-03-22 NOTE — Telephone Encounter (Signed)
  Follow up Call-     03/19/2023   12:48 PM 03/19/2023   12:44 PM  Call back number  Post procedure Call Back phone  # [phone]   Permission to leave phone message  Yes     Patient questions:  Do you have a fever, pain , or abdominal swelling? No. Pain Score  0 *  Have you tolerated food without any problems? Yes.    Have you been able to return to your normal activities? Yes.    Do you have any questions about your discharge instructions: Diet   No. Medications  No. Follow up visit  No.  Do you have questions or concerns about your Care? No.  Actions: * If pain score is 4 or above: No action needed, pain <4.

## 2023-03-24 LAB — SURGICAL PATHOLOGY

## 2023-04-08 ENCOUNTER — Encounter: Payer: Self-pay | Admitting: Gastroenterology

## 2023-07-05 ENCOUNTER — Encounter: Payer: 59 | Admitting: Gastroenterology
